# Patient Record
Sex: Male | Born: 1980 | Race: White | Hispanic: No | Marital: Single | Smoking: Current every day smoker
Health system: Southern US, Community
[De-identification: ages and names within clinical notes are randomized; demographics above are authoritative.]

## PROBLEM LIST (undated history)

## (undated) DIAGNOSIS — N200 Calculus of kidney: Secondary | ICD-10-CM

---

## 2012-05-16 ENCOUNTER — Emergency Department: Payer: Self-pay | Admitting: Internal Medicine

## 2012-11-07 ENCOUNTER — Emergency Department: Payer: Self-pay

## 2013-03-14 ENCOUNTER — Emergency Department: Payer: Self-pay | Admitting: Emergency Medicine

## 2013-07-28 ENCOUNTER — Emergency Department: Payer: Self-pay | Admitting: Internal Medicine

## 2013-11-12 ENCOUNTER — Emergency Department: Payer: Self-pay | Admitting: Emergency Medicine

## 2014-07-14 ENCOUNTER — Encounter: Payer: Self-pay | Admitting: Emergency Medicine

## 2014-07-14 ENCOUNTER — Emergency Department
Admission: EM | Admit: 2014-07-14 | Discharge: 2014-07-14 | Disposition: A | Discharge status: Home / Self Care | Payer: Self-pay | Attending: Emergency Medicine | Admitting: Emergency Medicine

## 2014-07-14 ENCOUNTER — Emergency Department: Payer: Self-pay

## 2014-07-14 DIAGNOSIS — Z720 Tobacco use: Secondary | ICD-10-CM | POA: Insufficient documentation

## 2014-07-14 DIAGNOSIS — R109 Unspecified abdominal pain: Secondary | ICD-10-CM | POA: Insufficient documentation

## 2014-07-14 DIAGNOSIS — M549 Dorsalgia, unspecified: Secondary | ICD-10-CM | POA: Insufficient documentation

## 2014-07-14 HISTORY — DX: Calculus of kidney: N20.0

## 2014-07-14 LAB — URINALYSIS COMPLETE WITH MICROSCOPIC (ARMC ONLY)
BILIRUBIN URINE: NEGATIVE
Bacteria, UA: NONE SEEN
GLUCOSE, UA: NEGATIVE mg/dL
Ketones, ur: NEGATIVE mg/dL
LEUKOCYTES UA: NEGATIVE
Nitrite: NEGATIVE
PH: 7 (ref 5.0–8.0)
PROTEIN: NEGATIVE mg/dL
Specific Gravity, Urine: 1.014 (ref 1.005–1.030)

## 2014-07-14 LAB — COMPREHENSIVE METABOLIC PANEL
ALT: 14 U/L — ABNORMAL LOW (ref 17–63)
ANION GAP: 9 (ref 5–15)
AST: 21 U/L (ref 15–41)
Albumin: 4.1 g/dL (ref 3.5–5.0)
Alkaline Phosphatase: 45 U/L (ref 38–126)
BILIRUBIN TOTAL: 0.9 mg/dL (ref 0.3–1.2)
BUN: 8 mg/dL (ref 6–20)
CHLORIDE: 105 mmol/L (ref 101–111)
CO2: 28 mmol/L (ref 22–32)
CREATININE: 0.94 mg/dL (ref 0.61–1.24)
Calcium: 8.7 mg/dL — ABNORMAL LOW (ref 8.9–10.3)
GFR calc Af Amer: >60 mL/min (ref >60)
Glucose, Bld: 114 mg/dL — ABNORMAL HIGH (ref 65–99)
POTASSIUM: 3.8 mmol/L (ref 3.5–5.1)
Sodium: 142 mmol/L (ref 135–145)
Total Protein: 6.8 g/dL (ref 6.5–8.1)

## 2014-07-14 LAB — CBC WITH DIFFERENTIAL/PLATELET
Basophils Absolute: 0.1 10*3/uL (ref 0–0.1)
Basophils Relative: 1 %
EOS PCT: 1 %
Eosinophils Absolute: 0.1 10*3/uL (ref 0–0.7)
HCT: 47.9 % (ref 40.0–52.0)
Hemoglobin: 15.8 g/dL (ref 13.0–18.0)
Lymphocytes Relative: 21 %
Lymphs Abs: 2.4 10*3/uL (ref 1.0–3.6)
MCH: 31.4 pg (ref 26.0–34.0)
MCHC: 33 g/dL (ref 32.0–36.0)
MCV: 95.4 fL (ref 80.0–100.0)
MONOS PCT: 4 %
Monocytes Absolute: 0.5 10*3/uL (ref 0.2–1.0)
NEUTROS ABS: 8.3 10*3/uL — AB (ref 1.4–6.5)
Neutrophils Relative %: 73 %
Platelets: 235 10*3/uL (ref 150–440)
RBC: 5.02 MIL/uL (ref 4.40–5.90)
RDW: 12.9 % (ref 11.5–14.5)
WBC: 11.4 10*3/uL — AB (ref 3.8–10.6)

## 2014-07-14 MED ORDER — FENTANYL CITRATE (PF) 100 MCG/2ML IJ SOLN
INTRAMUSCULAR | Status: AC
  Administered 2014-07-14: 50 ug via INTRAVENOUS
  Filled 2014-07-14: qty 2

## 2014-07-14 MED ORDER — FENTANYL CITRATE (PF) 100 MCG/2ML IJ SOLN
50.0000 ug | Freq: Once | INTRAMUSCULAR | Status: AC
  Administered 2014-07-14: 100 ug via INTRAVENOUS

## 2014-07-14 MED ORDER — KETOROLAC TROMETHAMINE 30 MG/ML IJ SOLN
30.0000 mg | Freq: Once | INTRAMUSCULAR | Status: AC
  Administered 2014-07-14: 30 mg via INTRAVENOUS

## 2014-07-14 MED ORDER — KETOROLAC TROMETHAMINE 30 MG/ML IJ SOLN
INTRAMUSCULAR | Status: AC
  Filled 2014-07-14: qty 1

## 2014-07-14 MED ORDER — SODIUM CHLORIDE 0.9 % IV BOLUS (SEPSIS)
1000.0000 mL | Freq: Once | INTRAVENOUS | Status: AC
  Administered 2014-07-14: 1000 mL via INTRAVENOUS

## 2014-07-14 NOTE — Discharge Instructions (Signed)
Flank Pain °Flank pain refers to pain that is located on the side of the body between the upper abdomen and the back. The pain may occur over a short period of time (acute) or may be long-term or reoccurring (chronic). It may be mild or severe. Flank pain can be caused by many things. °CAUSES  °Some of the more common causes of flank pain include: °· Muscle strains.   °· Muscle spasms.   °· A disease of your spine (vertebral disk disease).   °· A lung infection (pneumonia).   °· Fluid around your lungs (pulmonary edema).   °· A kidney infection.   °· Kidney stones.   °· A very painful skin rash caused by the chickenpox virus (shingles).   °· Gallbladder disease.   °HOME CARE INSTRUCTIONS  °Home care will depend on the cause of your pain. In general, °· Rest as directed by your caregiver. °· Drink enough fluids to keep your urine clear or pale yellow. °· Only take over-the-counter or prescription medicines as directed by your caregiver. Some medicines may help relieve the pain. °· Tell your caregiver about any changes in your pain. °· Follow up with your caregiver as directed. °SEEK IMMEDIATE MEDICAL CARE IF:  °· Your pain is not controlled with medicine.   °· You have new or worsening symptoms. °· Your pain increases.   °· You have abdominal pain.   °· You have shortness of breath.   °· You have persistent nausea or vomiting.   °· You have swelling in your abdomen.   °· You feel faint or pass out.   °· You have blood in your urine. °· You have a fever or persistent symptoms for more than 2-3 days. °· You have a fever and your symptoms suddenly get worse. °MAKE SURE YOU:  °· Understand these instructions. °· Will watch your condition. °· Will get help right away if you are not doing well or get worse. °Document Released: 03/31/2005 Document Revised: 11/02/2011 Document Reviewed: 09/22/2011 °Exit[employer]® Patient Information ©2015 [employer]. This information is not intended to replace advice given to you by your  health care provider. Make sure you discuss any questions you have with your health care provider. ° °

## 2014-07-14 NOTE — ED Notes (Signed)
Right side flank pain rad to right abd for 2 days.

## 2014-07-14 NOTE — ED Provider Notes (Signed)
[facility] Emergency Department Provider Note  ____________________________________________  Time seen: 1640  I have reviewed the triage vital signs and the nursing notes.   HISTORY  Chief Complaint Back Pain and Flank Pain     HPI Leslie Hobbs is a 34 y.o. male who presents with pain in his right back and right flank. He reports this began yesterday and was dull and nagging. Now it is wrapping around further on his abdomen and lower down. He denies any GI symptoms except some mild nausea earlier today. He's had no vomiting or diarrhea. He says his mother told him he had a kidney stone when he was 12. He is not responsive about this history. The pain,'s and goes but it is worse when he is urinating. At times it becomes severe.  While he has been waiting to be seen, he has been treated with 2 doses of fentanyl 50 g each. He reports this is health with the pain. At this time he looks comfortable.    Past Medical History  Diagnosis Date  . Kidney stone     There are no active problems to display for this patient.   No past surgical history on file.  No current outpatient prescriptions on file.  Allergies Review of patient's allergies indicates no known allergies.  No family history on file.  Social History History  Substance Use Topics  . Smoking status: Current Every Day Smoker  . Smokeless tobacco: Not on file  . Alcohol Use: No    Review of Systems  Constitutional: Negative for fever. ENT: Negative for sore throat. Cardiovascular: Negative for chest pain. Respiratory: Negative for shortness of breath. Gastrointestinal: Negative for abdominal pain, vomiting and diarrhea. Genitourinary: Flank pain with urination. See history of present illness Musculoskeletal: Negative for back pain. Skin: Negative for rash. Neurological: Negative for headaches   10-point ROS otherwise  negative.  ____________________________________________   PHYSICAL EXAM:  VITAL SIGNS: ED Triage Vitals  Enc Vitals Group     BP 07/14/14 1338 134/84 mmHg     Pulse Rate 07/14/14 1338 87     Resp 07/14/14 1338 16     Temp 07/14/14 1338 97.8 F (36.6 C)     Temp Source 07/14/14 1338 Oral     SpO2 07/14/14 1338 100 %     Weight 07/14/14 1338 161 lb (73.029 kg)     Height 07/14/14 1338 6' (1.829 m)     Head Cir --      Peak Flow --      Pain Score 07/14/14 1338 5     Pain Loc --      Pain Edu? --      Excl. in GC? --     Constitutional:  Alert and oriented. Well appearing and in no distress. ENT   Head: Normocephalic and atraumatic.   Nose: No congestion/rhinnorhea.   Mouth/Throat: Mucous membranes are moist.      Ears: Cardiovascular: Normal rate, regular rhythm. Respiratory: Normal respiratory effort without tachypnea. Breath sounds are clear and equal bilaterally. No wheezes/rales/rhonchi. Gastrointestinal: Soft with mild tenderness in the right side of the abdomen. No peritoneal signs. No distention.  Back: No muscle spasm, no tenderness. There is right-sided CVA tenderness. Musculoskeletal: Nontender with normal range of motion in all extremities.  No noted edema. Neurologic:  Normal speech and language. No gross focal neurologic deficits are appreciated.  Skin:  Skin is warm, dry. No rash noted. Psychiatric: Mood and affect are normal. Speech and  behavior are normal.  ____________________________________________    LABS (pertinent positives/negatives)  CBC and metabolic panel are within normal limits Urinalysis shows red blood cells too numerous to count.  _____________________________   RADIOLOGY  Renal ultrasound:  IMPRESSION: Unremarkable renal ultrasound examination.  ____________________________________________  ____________________________________________   INITIAL IMPRESSION / ASSESSMENT AND PLAN / ED COURSE  Pleasant 34 year old  male with right-sided flank pain. He does have hematuria noted on the urinalysis. This is most likely a renal stone. He does look comfortable currently. Before treating with ongoing pain medication I would like to confirm the diagnosis. We discussed the pros and cons of CT versus ultrasound. We will do an ultrasound at this time. I will treat him with Toradol through an IV that 30 been established for his discomfort.  1835 - ultrasound is negative. No sign of hydroureter, no stone. The patient looks comfortable. He is eager to leave because he has to help his wife get to work at 8:00. We have discussed the negative ultrasound and I have offered to do a CT scan for him, but due to his time constraint he is declining this currently. With the negative ultrasound I have a reservation about prescribing any narcotic pain medications. We will discharge him, appearing comfortable, without prescription. We will provide him with the name and number of the urologist for follow-up. He is welcome to come back here if his pain gets worse and we can do a CT scan. ____________________________________________   FINAL CLINICAL IMPRESSION(S) / ED DIAGNOSES  Final diagnoses:  Right flank pain      Darien Ramusavid W Nandini Bogdanski, MD 07/14/14 1836

## 2018-08-08 ENCOUNTER — Emergency Department (HOSPITAL_COMMUNITY)
Admission: EM | Admit: 2018-08-08 | Discharge: 2018-08-08 | Disposition: A | Discharge status: Home / Self Care | Payer: Self-pay

## 2018-08-08 ENCOUNTER — Emergency Department
Admission: EM | Admit: 2018-08-08 | Discharge: 2018-08-08 | Disposition: A | Discharge status: Home / Self Care | Payer: Self-pay | Attending: Emergency Medicine | Admitting: Emergency Medicine

## 2018-08-08 ENCOUNTER — Encounter: Payer: Self-pay | Admitting: Emergency Medicine

## 2018-08-08 ENCOUNTER — Other Ambulatory Visit: Payer: Self-pay

## 2018-08-08 DIAGNOSIS — Z5321 Procedure and treatment not carried out due to patient leaving prior to being seen by health care provider: Secondary | ICD-10-CM | POA: Insufficient documentation

## 2018-08-08 DIAGNOSIS — R079 Chest pain, unspecified: Secondary | ICD-10-CM | POA: Insufficient documentation

## 2018-08-08 LAB — BASIC METABOLIC PANEL
Anion gap: 9 (ref 5–15)
BUN: 12 mg/dL (ref 6–20)
CO2: 28 mmol/L (ref 22–32)
Calcium: 8.8 mg/dL — ABNORMAL LOW (ref 8.9–10.3)
Chloride: 105 mmol/L (ref 98–111)
Creatinine, Ser: 0.72 mg/dL (ref 0.61–1.24)
GFR calc Af Amer: >60 mL/min (ref >60)
GFR calc non Af Amer: >60 mL/min (ref >60)
Glucose, Bld: 90 mg/dL (ref 70–99)
Potassium: 4.2 mmol/L (ref 3.5–5.1)
Sodium: 142 mmol/L (ref 135–145)

## 2018-08-08 LAB — CBC
HCT: 38.6 % — ABNORMAL LOW (ref 39.0–52.0)
Hemoglobin: 12.6 g/dL — ABNORMAL LOW (ref 13.0–17.0)
MCH: 32 pg (ref 26.0–34.0)
MCHC: 32.6 g/dL (ref 30.0–36.0)
MCV: 98 fL (ref 80.0–100.0)
Platelets: 178 10*3/uL (ref 150–400)
RBC: 3.94 MIL/uL — ABNORMAL LOW (ref 4.22–5.81)
RDW: 12.4 % (ref 11.5–15.5)
WBC: 6.7 10*3/uL (ref 4.0–10.5)
nRBC: 0 % (ref 0.0–0.2)

## 2018-08-08 LAB — TROPONIN I: Troponin I: <0.03 ng/mL (ref <0.03)

## 2018-08-08 MED ORDER — SODIUM CHLORIDE 0.9% FLUSH: 3.0000 mL | Freq: Once | INTRAVENOUS | Status: DC

## 2018-08-08 NOTE — ED Triage Notes (Signed)
Patient not present in waiting area or restroom.

## 2018-08-08 NOTE — ED Triage Notes (Signed)
Patient called for Triage x 2. Not in waiting area.

## 2018-08-08 NOTE — ED Triage Notes (Signed)
O mid to right sided chest pain x 3 days.  No action / activity makes pain better or worse.  Also c/o nausea and intermittent SOB.

## 2018-08-08 NOTE — ED Triage Notes (Signed)
Patient called for Triage. No answer. 

## 2020-09-11 ENCOUNTER — Encounter (HOSPITAL_COMMUNITY): Payer: Self-pay

## 2020-09-11 ENCOUNTER — Other Ambulatory Visit: Payer: Self-pay

## 2020-09-11 ENCOUNTER — Emergency Department (HOSPITAL_COMMUNITY)
Admission: EM | Admit: 2020-09-11 | Discharge: 2020-09-11 | Disposition: A | Discharge status: Home / Self Care | Payer: Self-pay | Attending: Emergency Medicine | Admitting: Emergency Medicine

## 2020-09-11 DIAGNOSIS — Y900 Blood alcohol level of less than 20 mg/100 ml: Secondary | ICD-10-CM | POA: Insufficient documentation

## 2020-09-11 DIAGNOSIS — T50901A Poisoning by unspecified drugs, medicaments and biological substances, accidental (unintentional), initial encounter: Secondary | ICD-10-CM

## 2020-09-11 DIAGNOSIS — T402X1A Poisoning by other opioids, accidental (unintentional), initial encounter: Secondary | ICD-10-CM | POA: Insufficient documentation

## 2020-09-11 DIAGNOSIS — F17200 Nicotine dependence, unspecified, uncomplicated: Secondary | ICD-10-CM | POA: Insufficient documentation

## 2020-09-11 LAB — COMPREHENSIVE METABOLIC PANEL
ALT: 28 U/L (ref 0–44)
AST: 25 U/L (ref 15–41)
Albumin: 4.8 g/dL (ref 3.5–5.0)
Alkaline Phosphatase: 64 U/L (ref 38–126)
Anion gap: 13 (ref 5–15)
BUN: 18 mg/dL (ref 6–20)
CO2: 24 mmol/L (ref 22–32)
Calcium: 8.9 mg/dL (ref 8.9–10.3)
Chloride: 101 mmol/L (ref 98–111)
Creatinine, Ser: 0.91 mg/dL (ref 0.61–1.24)
GFR, Estimated: >60 mL/min (ref >60)
Glucose, Bld: 178 mg/dL — ABNORMAL HIGH (ref 70–99)
Potassium: 4.5 mmol/L (ref 3.5–5.1)
Sodium: 138 mmol/L (ref 135–145)
Total Bilirubin: 1.2 mg/dL (ref 0.3–1.2)
Total Protein: 7.8 g/dL (ref 6.5–8.1)

## 2020-09-11 LAB — CBC
HCT: 46.8 % (ref 39.0–52.0)
Hemoglobin: 15.5 g/dL (ref 13.0–17.0)
MCH: 31.6 pg (ref 26.0–34.0)
MCHC: 33.1 g/dL (ref 30.0–36.0)
MCV: 95.5 fL (ref 80.0–100.0)
Platelets: 321 10*3/uL (ref 150–400)
RBC: 4.9 MIL/uL (ref 4.22–5.81)
RDW: 11.9 % (ref 11.5–15.5)
WBC: 15.3 10*3/uL — ABNORMAL HIGH (ref 4.0–10.5)
nRBC: 0 % (ref 0.0–0.2)

## 2020-09-11 LAB — ETHANOL: Alcohol, Ethyl (B): <10 mg/dL (ref <10)

## 2020-09-11 MED ORDER — NALOXONE HCL 4 MG/0.1ML NA LIQD: 1.0000 | Freq: Once | NASAL | 0 refills | Status: AC

## 2020-09-11 MED ORDER — ONDANSETRON 8 MG PO TBDP
8.0000 mg | ORAL_TABLET | Freq: Once | ORAL | Status: AC
  Administered 2020-09-11: 8 mg via ORAL
  Filled 2020-09-11: qty 1

## 2020-09-11 NOTE — ED Notes (Signed)
When providing discharge instructions to patient, he became nauseas and vomited in trash can. MD notified and presented to bedside. Pt oriented x4, ambulating in room. MD ordered prescription for narcan and patient instructed on importance and how to use.

## 2020-09-11 NOTE — ED Provider Notes (Signed)
[facility]EMERGENCY DEPT Provider Note   CSN: [id number] Arrival date & time: 09/11/20  1206     History Chief Complaint  Patient presents with   Drug Overdose    Leslie Hobbs is a 40 y.o. male.  Patient brought in for evaluation of decreased responsiveness.  Possibly use drugs.  Pulse ox was low and placed on oxygen.  Patient arousable to voice.  He denies any medical complaints.  He admits to using some fentanyl.  The history is provided by the patient.  Drug Overdose This is a new problem. The problem has not changed since onset.Pertinent negatives include no chest pain, no abdominal pain, no headaches and no shortness of breath. Nothing aggravates the symptoms. Nothing relieves the symptoms. He has tried nothing for the symptoms. The treatment provided no relief.      Past Medical History:  Diagnosis Date   Kidney stone     There are no problems to display for this patient.   History reviewed. No pertinent surgical history.     History reviewed. No pertinent family history.  Social History   Tobacco Use   Smoking status: Every Day   Smokeless tobacco: Never  Substance Use Topics   Alcohol use: No    Home Medications Prior to Admission medications   Not on File    Allergies    Patient has no known allergies.  Review of Systems   Review of Systems  Constitutional:  Negative for fever.  HENT:  Negative for sore throat.   Eyes:  Negative for visual disturbance.  Respiratory:  Negative for shortness of breath.   Cardiovascular:  Negative for chest pain.  Gastrointestinal:  Negative for abdominal pain.  Genitourinary:  Negative for dysuria.  Musculoskeletal:  Negative for neck pain.  Skin:  Negative for rash.  Neurological:  Negative for headaches.   Physical Exam Updated Vital Signs BP 126/84   Pulse 87   Temp 98.5 F (36.9 C) (Oral)   Resp 13   SpO2 94%   Physical Exam Vitals and nursing note reviewed.   Constitutional:      General: He is sleeping.     Appearance: Normal appearance. He is well-developed.  HENT:     Head: Normocephalic and atraumatic.  Eyes:     Conjunctiva/sclera: Conjunctivae normal.  Cardiovascular:     Rate and Rhythm: Normal rate and regular rhythm.     Heart sounds: No murmur heard. Pulmonary:     Effort: Pulmonary effort is normal. No respiratory distress.     Breath sounds: Normal breath sounds.  Abdominal:     Palpations: Abdomen is soft.     Tenderness: There is no abdominal tenderness.  Musculoskeletal:        General: No deformity or signs of injury. Normal range of motion.     Cervical back: Neck supple.  Skin:    General: Skin is warm and dry.  Neurological:     General: No focal deficit present.     Mental Status: He is easily aroused.    ED Results / Procedures / Treatments   Labs (all labs ordered are listed, but only abnormal results are displayed) Labs Reviewed  COMPREHENSIVE METABOLIC PANEL - Abnormal; Notable for the following components:      Result Value   Glucose, Bld 178 (*)    All other components within normal limits  CBC - Abnormal; Notable for the following components:   WBC 15.3 (*)    All other  components within normal limits  ETHANOL    EKG None  Radiology No results found.  Procedures Procedures   Medications Ordered in ED Medications - No data to display  ED Course  I have reviewed the triage vital signs and the nursing notes.  Pertinent labs & imaging results that were available during my care of the patient were reviewed by me and considered in my medical decision making (see chart for details).  Clinical Course as of 09/11/20 1905  Caleen Essex Sep 11, 2020  1355 Patient placed on oxygen for low sat.  Sleeping soundly. [MB]    Clinical Course User Index [MB] Terrilee Files, MD   MDM Rules/Calculators/A&P                          Patient here after probable opiate overdose.  Somnolent arousable to  voice.  Requiring some supplemental oxygen due to desaturations.  Labs unremarkable.  Normal sinus rhythm on monitor.  Over period of time patient with improvement of mental status now off oxygen satting well on room air.  Vomited x1.  Zofran given.  Recommended patient to stay in the emergency department for further period of observation.  He declines this and is asking to be discharged.  Will discharge with Narcan prescription.  Counseled to seek substance abuse resources.  Return instructions discussed  Final Clinical Impression(s) / ED Diagnoses Final diagnoses:  Accidental drug overdose, initial encounter    Rx / DC Orders ED Discharge Orders     None        Terrilee Files, MD 09/11/20 1907

## 2020-09-11 NOTE — ED Triage Notes (Signed)
Brought back from triage, O2 on RA was 86%, admitting to snorting something, denies IV drug use. Somnolent.

## 2020-09-11 NOTE — ED Triage Notes (Signed)
Pt POV , states he took "some powdered aspirin" and then got hot. Pt friend stated he took fentanyl and "passed out:" Pt responsive in triage, slow to answer. Sp2 86% RA

## 2020-10-03 ENCOUNTER — Emergency Department
Admission: EM | Admit: 2020-10-03 | Discharge: 2020-10-03 | Disposition: A | Discharge status: Home / Self Care | Payer: Self-pay | Attending: Emergency Medicine | Admitting: Emergency Medicine

## 2020-10-03 ENCOUNTER — Other Ambulatory Visit: Payer: Self-pay

## 2020-10-03 DIAGNOSIS — F17200 Nicotine dependence, unspecified, uncomplicated: Secondary | ICD-10-CM | POA: Insufficient documentation

## 2020-10-03 DIAGNOSIS — L0201 Cutaneous abscess of face: Secondary | ICD-10-CM | POA: Insufficient documentation

## 2020-10-03 LAB — CBC
HCT: 41.1 % (ref 39.0–52.0)
Hemoglobin: 14.5 g/dL (ref 13.0–17.0)
MCH: 32.7 pg (ref 26.0–34.0)
MCHC: 35.3 g/dL (ref 30.0–36.0)
MCV: 92.8 fL (ref 80.0–100.0)
Platelets: 199 10*3/uL (ref 150–400)
RBC: 4.43 MIL/uL (ref 4.22–5.81)
RDW: 11.8 % (ref 11.5–15.5)
WBC: 11.2 10*3/uL — ABNORMAL HIGH (ref 4.0–10.5)
nRBC: 0 % (ref 0.0–0.2)

## 2020-10-03 LAB — COMPREHENSIVE METABOLIC PANEL
ALT: 30 U/L (ref 0–44)
AST: 23 U/L (ref 15–41)
Albumin: 4 g/dL (ref 3.5–5.0)
Alkaline Phosphatase: 54 U/L (ref 38–126)
Anion gap: 11 (ref 5–15)
BUN: 9 mg/dL (ref 6–20)
CO2: 25 mmol/L (ref 22–32)
Calcium: 8.7 mg/dL — ABNORMAL LOW (ref 8.9–10.3)
Chloride: 98 mmol/L (ref 98–111)
Creatinine, Ser: 0.77 mg/dL (ref 0.61–1.24)
GFR, Estimated: >60 mL/min (ref >60)
Glucose, Bld: 117 mg/dL — ABNORMAL HIGH (ref 70–99)
Potassium: 4 mmol/L (ref 3.5–5.1)
Sodium: 134 mmol/L — ABNORMAL LOW (ref 135–145)
Total Bilirubin: 1.7 mg/dL — ABNORMAL HIGH (ref 0.3–1.2)
Total Protein: 7.1 g/dL (ref 6.5–8.1)

## 2020-10-03 LAB — LACTIC ACID, PLASMA: Lactic Acid, Venous: 1.2 mmol/L (ref 0.5–1.9)

## 2020-10-03 MED ORDER — LIDOCAINE-EPINEPHRINE 2 %-1:100000 IJ SOLN
20.0000 mL | Freq: Once | INTRAMUSCULAR | Status: AC
  Administered 2020-10-03: 20 mL

## 2020-10-03 MED ORDER — SULFAMETHOXAZOLE-TRIMETHOPRIM 800-160 MG PO TABS: 1.0000 | ORAL_TABLET | Freq: Two times a day (BID) | ORAL | 0 refills | Status: AC

## 2020-10-03 MED ORDER — CEPHALEXIN 500 MG PO CAPS: 500.0000 mg | ORAL_CAPSULE | Freq: Four times a day (QID) | ORAL | 0 refills | Status: AC

## 2020-10-03 MED ORDER — SULFAMETHOXAZOLE-TRIMETHOPRIM 800-160 MG PO TABS
1.0000 | ORAL_TABLET | Freq: Once | ORAL | Status: AC
  Administered 2020-10-03: 1 via ORAL
  Filled 2020-10-03: qty 1

## 2020-10-03 MED ORDER — CEPHALEXIN 500 MG PO CAPS
500.0000 mg | ORAL_CAPSULE | Freq: Once | ORAL | Status: AC
  Administered 2020-10-03: 500 mg via ORAL
  Filled 2020-10-03: qty 1

## 2020-10-03 NOTE — ED Provider Notes (Signed)
[facility] Emergency Department Provider Note   ____________________________________________   Event Date/Time   First MD Initiated Contact with Patient 10/03/20 1231     (approximate)  I have reviewed the triage vital signs and the nursing notes.   HISTORY  Chief Complaint Abscess    HPI Leslie Hobbs is a 40 y.o. male with no significant past medical history presents to the ED complaining of facial swelling.  Patient reports that for the past 3 to 4 days he has been dealing with increasing swelling to his right lower face.  He states that he was initially concerned that he could have an ingrown hair but that the area has gotten more red, swollen, and painful.  He was seen in the [facility] ED yesterday, reports having a CT scan that was concerning for abscess.  There was concerned that this could be related to a dental infection and arrangements were made for patient to be transferred to [facility].  He eventually signed out AMA after receiving IV antibiotics prior to transfer occurring.  He states that the swelling seems to have improved since he received antibiotics, he has also noticed pus starting to drain from the outside of his cheek.  He denies any fevers, nausea or vomiting but does describe difficulty eating and drinking due to pain when moving his jaw.  He has not noticed any pain or swelling along his teeth and denies any pain in his throat.  He states he was not prescribed antibiotics when leaving the [facility] ED.  He does state that he was stung by bees multiple times to his bilateral upper extremities 1 to 2 weeks ago.  He adamantly denies any IV drug use, does admit to occasionally using opiates, which he snorts.        Past Medical History:  Diagnosis Date   Kidney stone     There are no problems to display for this patient.   History reviewed. No pertinent surgical history.  Prior to Admission medications   Medication Sig  Start Date End Date Taking? Authorizing Provider  cephALEXin (KEFLEX) 500 MG capsule Take 1 capsule (500 mg total) by mouth 4 (four) times daily for 7 days. 10/03/20 10/10/20 Yes Chesley Noon, MD  sulfamethoxazole-trimethoprim (BACTRIM DS) 800-160 MG tablet Take 1 tablet by mouth 2 (two) times daily for 7 days. 10/03/20 10/10/20 Yes Chesley Noon, MD    Allergies Patient has no known allergies.  History reviewed. No pertinent family history.  Social History Social History   Tobacco Use   Smoking status: Every Day   Smokeless tobacco: Never  Substance Use Topics   Alcohol use: No    Review of Systems  Constitutional: No fever/chills Eyes: No visual changes. ENT: No sore throat.  Positive for facial pain and swelling. Cardiovascular: Denies chest pain. Respiratory: Denies shortness of breath. Gastrointestinal: No abdominal pain.  No nausea, no vomiting.  No diarrhea.  No constipation. Genitourinary: Negative for dysuria. Musculoskeletal: Negative for back pain. Skin: Negative for rash. Neurological: Negative for headaches, focal weakness or numbness.  ____________________________________________   PHYSICAL EXAM:  VITAL SIGNS: ED Triage Vitals  Enc Vitals Group     BP 10/03/20 1129 117/88     Pulse Rate 10/03/20 1129 (!) 115     Resp 10/03/20 1129 18     Temp 10/03/20 1129 99.2 F (37.3 C)     Temp Source 10/03/20 1129 Oral     SpO2 10/03/20 1129 98 %  Weight 10/03/20 1130 180 lb (81.6 kg)     Height 10/03/20 1130 6' (1.829 m)     Head Circumference --      Peak Flow --      Pain Score 10/03/20 1129 6     Pain Loc --      Pain Edu? --      Excl. in GC? --     Constitutional: Alert and oriented. Eyes: Conjunctivae are normal. Head: Atraumatic.  Edema, erythema, and induration with central area of purulent drainage to right lower cheek. Nose: No congestion/rhinnorhea. Mouth/Throat: Mucous membranes are moist.  Poor dentition but with no edema or fluctuance  along upper or lower gumline bilaterally.  No dental tenderness to palpation. Neck: Normal ROM Cardiovascular: Normal rate, regular rhythm. Grossly normal heart sounds. Respiratory: Normal respiratory effort.  No retractions. Lungs CTAB. Gastrointestinal: Soft and nontender. No distention. Genitourinary: deferred Musculoskeletal: No lower extremity tenderness nor edema. Neurologic:  Normal speech and language. No gross focal neurologic deficits are appreciated. Skin:  Skin is warm, dry and intact. No rash noted. Psychiatric: Mood and affect are normal. Speech and behavior are normal.  ____________________________________________   LABS (all labs ordered are listed, but only abnormal results are displayed)  Labs Reviewed  CBC - Abnormal; Notable for the following components:      Result Value   WBC 11.2 (*)    All other components within normal limits  COMPREHENSIVE METABOLIC PANEL - Abnormal; Notable for the following components:   Sodium 134 (*)    Glucose, Bld 117 (*)    Calcium 8.7 (*)    Total Bilirubin 1.7 (*)    All other components within normal limits  CULTURE, BLOOD (ROUTINE X 2)  CULTURE, BLOOD (ROUTINE X 2)  LACTIC ACID, PLASMA  LACTIC ACID, PLASMA    PROCEDURES  Procedure(s) performed (including Critical Care):  Marland KitchenMarland KitchenIncision and Drainage  Date/Time: 10/03/2020 2:07 PM Performed by: Chesley Noon, MD Authorized by: Chesley Noon, MD   Consent:    Consent obtained:  Verbal   Consent given by:  Patient   Risks, benefits, and alternatives were discussed: yes     Risks discussed:  Bleeding, incomplete drainage, pain, infection and damage to other organs   Alternatives discussed:  No treatment and referral Universal protocol:    Patient identity confirmed:  Verbally with patient and arm band Location:    Type:  Abscess   Size:  1-2cm   Location:  Head   Head location:  Face Sedation:    Sedation type:  None Anesthesia:    Anesthesia method:  Local  infiltration   Local anesthetic:  Lidocaine 1% WITH epi Procedure type:    Complexity:  Simple Procedure details:    Ultrasound guidance: no     Needle aspiration: no     Incision types:  Elliptical   Wound management:  Probed and deloculated and irrigated with saline   Drainage:  Purulent   Drainage amount:  Moderate   Wound treatment:  Wound left open   Packing materials:  None Post-procedure details:    Procedure completion:  Tolerated well, no immediate complications   ____________________________________________   INITIAL IMPRESSION / ASSESSMENT AND PLAN / ED COURSE      40 year old male with no significant past medical history presents to the ED with ongoing facial pain and swelling after being seen in the [facility] ED and given IV antibiotics yesterday.  There was initial concern in the ED yesterday that he could have odontogenic  infection, although he has no dental tenderness, edema, or fluctuance on my evaluation.  Infectious process does appear more superficial and limited to his cheek.  I am not able to review images from the [facility] ED although per radiology read a superficial infection is on the differential.  He is having purulent drainage from his cheek which again seems to make a dental infection less likely.  We will anesthetized the area with lidocaine and plan to make a small incision to allow for additional drainage of apparent abscess.  Patient noted to be tachycardic with elevated white count, although he is afebrile and does not appear septic at this time.  Given his history of polysubstance abuse, we will draw blood cultures and lactate to ensure he is not bacteremic.  Lactate within normal limits, incision and drainage performed of right cheek with improvement in patient's pain.  He was able to tolerate oral antibiotics without difficulty and is appropriate for discharge home.  He was counseled to return to the ED in 2 days for wound recheck, will be prescribed  Keflex and Bactrim for associated cellulitis.  Patient agrees with plan.      ____________________________________________   FINAL CLINICAL IMPRESSION(S) / ED DIAGNOSES  Final diagnoses:  Facial abscess     ED Discharge Orders          Ordered    cephALEXin (KEFLEX) 500 MG capsule  4 times daily        10/03/20 1525    sulfamethoxazole-trimethoprim (BACTRIM DS) 800-160 MG tablet  2 times daily        10/03/20 1525             Note:  This document was prepared using Dragon voice recognition software and may include unintentional dictation errors.    Chesley Noon, MD 10/03/20 [phone]

## 2020-10-03 NOTE — ED Notes (Signed)
Discussed with Dr Larinda Buttery. Basic labs repeated.

## 2020-10-03 NOTE — ED Triage Notes (Signed)
Pt comes pov with abscess on right cheek. Was seen at [facility] last night, had a lot of blood taken, and CT scan done, but MD didn't "feel comfortable" lancing it last night. Had IV antibiotics at [facility] and was d/c.

## 2020-10-03 NOTE — ED Notes (Signed)
Lab called for blood cultures.

## 2020-10-08 LAB — CULTURE, BLOOD (ROUTINE X 2)
Culture: NO GROWTH
Culture: NO GROWTH
Special Requests: ADEQUATE

## 2021-01-24 ENCOUNTER — Emergency Department: Payer: Self-pay

## 2021-01-24 ENCOUNTER — Emergency Department
Admission: EM | Admit: 2021-01-24 | Discharge: 2021-01-24 | Disposition: A | Discharge status: Home / Self Care | Payer: Self-pay | Attending: Emergency Medicine | Admitting: Emergency Medicine

## 2021-01-24 ENCOUNTER — Other Ambulatory Visit: Payer: Self-pay

## 2021-01-24 ENCOUNTER — Encounter: Payer: Self-pay | Admitting: Emergency Medicine

## 2021-01-24 DIAGNOSIS — W230XXA Caught, crushed, jammed, or pinched between moving objects, initial encounter: Secondary | ICD-10-CM | POA: Insufficient documentation

## 2021-01-24 DIAGNOSIS — F17200 Nicotine dependence, unspecified, uncomplicated: Secondary | ICD-10-CM | POA: Insufficient documentation

## 2021-01-24 DIAGNOSIS — S60221A Contusion of right hand, initial encounter: Secondary | ICD-10-CM | POA: Insufficient documentation

## 2021-01-24 DIAGNOSIS — L03012 Cellulitis of left finger: Secondary | ICD-10-CM | POA: Insufficient documentation

## 2021-01-24 MED ORDER — SULFAMETHOXAZOLE-TRIMETHOPRIM 800-160 MG PO TABS
1.0000 | ORAL_TABLET | Freq: Once | ORAL | Status: AC
  Administered 2021-01-24: 18:00:00 1 via ORAL
  Filled 2021-01-24: qty 1

## 2021-01-24 MED ORDER — SULFAMETHOXAZOLE-TRIMETHOPRIM 800-160 MG PO TABS: 1.0000 | ORAL_TABLET | Freq: Two times a day (BID) | ORAL | 0 refills | Status: AC

## 2021-01-24 NOTE — ED Triage Notes (Signed)
Pt reports slammed his right hand thumb in a door yesterday and thinks it may be broken. Swelling noted, states painful to move

## 2021-01-24 NOTE — ED Notes (Signed)
Dc ppw provided to patient. Followup information provided. RX information given. Questions answered. Pt provides verbal consent for discharge. VS declined at dc. Pt assisted off unit. 

## 2021-01-24 NOTE — ED Provider Notes (Signed)
[facility] Emergency Department Provider Note  ____________________________________________  Time seen: Approximately 5:05 PM  I have reviewed the triage vital signs and the nursing notes.   HISTORY  Chief Complaint Hand Pain    HPI Leslie Hobbs is a 40 y.o. male who presents emergency department for 2 complaints.  Patient is primarily concerned about his right hand.  He smashed his finger in a barn door yesterday, is having pain and swelling to the area.  Patient is also concerned that he may have an infection to the digit.  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  He states that the skin split, has been draining some purulent material since he injured his thumb yesterday.  No gross swelling of the thumb.  He still able to extend and flex the thumb at this time.       Past Medical History:  Diagnosis Date   Kidney stone     There are no problems to display for this patient.   History reviewed. No pertinent surgical history.  Prior to Admission medications   Medication Sig Start Date End Date Taking? Authorizing Provider  sulfamethoxazole-trimethoprim (BACTRIM DS) 800-160 MG tablet Take 1 tablet by mouth 2 (two) times daily. 01/24/21  Yes Rogelio Winbush, Delorise Royals, PA-C    Allergies Patient has no known allergies.  No family history on file.  Social History Social History   Tobacco Use   Smoking status: Every Day   Smokeless tobacco: Never  Substance Use Topics   Alcohol use: No     Review of Systems  Constitutional: No fever/chills Eyes: No visual changes. No discharge ENT: No upper respiratory complaints. Cardiovascular: no chest pain. Respiratory: no cough. No SOB. Gastrointestinal: No abdominal pain.  No nausea, no vomiting.  No diarrhea.  No constipation. Musculoskeletal: Injury and possible infection to the right thumb Skin: Negative for rash, abrasions, lacerations,  ecchymosis. Neurological: Negative for headaches, focal weakness or numbness.  10 System ROS otherwise negative.  ____________________________________________   PHYSICAL EXAM:  VITAL SIGNS: ED Triage Vitals  Enc Vitals Group     BP 01/24/21 1519 111/89     Pulse Rate 01/24/21 1519 94     Resp 01/24/21 1519 20     Temp 01/24/21 1519 98.2 F (36.8 C)     Temp Source 01/24/21 1519 Oral     SpO2 01/24/21 1519 97 %     Weight 01/24/21 1444 180 lb (81.6 kg)     Height 01/24/21 1444 6' (1.829 m)     Head Circumference --      Peak Flow --      Pain Score 01/24/21 1444 7     Pain Loc --      Pain Edu? --      Excl. in GC? --      Constitutional: Alert and oriented. Well appearing and in no acute distress. Eyes: Conjunctivae are normal. PERRL. EOMI. Head: Atraumatic. ENT:      Ears:       Nose: No congestion/rhinnorhea.      Mouth/Throat: Mucous membranes are moist.  Neck: No stridor.    Cardiovascular: Normal rate, regular rhythm. Normal S1 and S2.  Good peripheral circulation. Respiratory: Normal respiratory effort without tachypnea or retractions. Lungs CTAB. Good air entry to the bases with no decreased or absent breath sounds. Musculoskeletal: Full range of motion to all extremities. No gross deformities appreciated.  Visualization of the right thumb  reveals some edema and ecchymosis along the distal phalanx.  Along the medial nailbed there is some erythema, purulent drainage consistent with a paronychia.  No fluctuance concerning for underlying abscess or felon's.  No extension of the erythema or edema along the found to be concern for tenosynovitis.  Full range of motion.  Sensation and capillary refill intact. Neurologic:  Normal speech and language. No gross focal neurologic deficits are appreciated.  Skin:  Skin is warm, dry and intact. No rash noted. Psychiatric: Mood and affect are normal. Speech and behavior are normal. Patient exhibits appropriate insight and  judgement.   ____________________________________________   LABS (all labs ordered are listed, but only abnormal results are displayed)  Labs Reviewed - No data to display ____________________________________________  EKG   ____________________________________________  RADIOLOGY I personally viewed and evaluated these images as part of my medical decision making, as well as reviewing the written report by the radiologist.  ED Provider Interpretation: No acute traumatic findings about the right hand  DG Hand Complete Right  Result Date: 01/24/2021 CLINICAL DATA:  RIGHT thumb injury. Hand pain. Concern for infection. EXAM: RIGHT HAND - COMPLETE 3+ VIEW COMPARISON:  None. FINDINGS: Osseous alignment is normal. Bone mineralization is normal. No fracture line or displaced fracture fragment is seen. No acute-appearing cortical irregularity or osseous lesion. No destructive changes, focal demineralization or other secondary signs of osteomyelitis. Soft tissues about the RIGHT hand are unremarkable. No soft tissue gas seen. IMPRESSION: Normal plain film examination of the RIGHT hand. No acute findings. No fracture or dislocation. No evidence of osteomyelitis. No soft tissue gas. Electronically Signed   By: Bary Viviano M.D.   On: 01/24/2021 16:02    ____________________________________________    PROCEDURES  Procedure(s) performed:    Procedures    Medications  sulfamethoxazole-trimethoprim (BACTRIM DS) 800-160 MG per tablet 1 tablet (has no administration in time range)     ____________________________________________   INITIAL IMPRESSION / ASSESSMENT AND PLAN / ED COURSE  Pertinent labs & imaging results that were available during my care of the patient were reviewed by me and considered in my medical decision making (see chart for details).  Review of the [place] CSRS was performed in accordance of the [facility] prior to dispensing any controlled drugs.           Patient's  diagnosis is consistent with hand contusion, paronychia of the thumb.  Patient presents emergency department after smashing his finger in a barn door yesterday.  Patient is also concerned that he may have an infection to the area.  Imaging was reassuring with no acute traumatic findings and no evidence of osteomyelitis.  Exam was concerning for paronychia but no evidence of felon's or infectious tenosynovitis.  Patiently placed on Bactrim for infection.  Anti-inflammatory and Tylenol at home for symptom relief.  Follow-up with primary care as needed.  Return precautions discussed with the patient at this time.  There is no indication for incision and drainage of paronychia currently as it is spontaneously draining.. Patient is given ED precautions to return to the ED for any worsening or new symptoms.     ____________________________________________  FINAL CLINICAL IMPRESSION(S) / ED DIAGNOSES  Final diagnoses:  Contusion of right hand, initial encounter  Paronychia of left thumb      NEW MEDICATIONS STARTED DURING THIS VISIT:  ED Discharge Orders          Ordered    sulfamethoxazole-trimethoprim (BACTRIM DS) 800-160 MG tablet  2 times daily  01/24/21 1726                This chart was dictated using voice recognition software/Dragon. Despite best efforts to proofread, errors can occur which can change the meaning. Any change was purely unintentional.    Racheal Patches, PA-C 01/24/21 1729    Gilles Chiquito, MD 01/24/21 [phone]

## 2021-02-01 ENCOUNTER — Encounter: Payer: Self-pay | Admitting: *Deleted

## 2021-02-01 ENCOUNTER — Other Ambulatory Visit: Payer: Self-pay

## 2021-02-01 ENCOUNTER — Emergency Department: Payer: Self-pay

## 2021-02-01 DIAGNOSIS — M79601 Pain in right arm: Secondary | ICD-10-CM | POA: Insufficient documentation

## 2021-02-01 DIAGNOSIS — M79644 Pain in right finger(s): Secondary | ICD-10-CM | POA: Insufficient documentation

## 2021-02-01 DIAGNOSIS — Z5321 Procedure and treatment not carried out due to patient leaving prior to being seen by health care provider: Secondary | ICD-10-CM | POA: Insufficient documentation

## 2021-02-01 LAB — COMPREHENSIVE METABOLIC PANEL
ALT: 19 U/L (ref 0–44)
AST: 17 U/L (ref 15–41)
Albumin: 4.3 g/dL (ref 3.5–5.0)
Alkaline Phosphatase: 57 U/L (ref 38–126)
Anion gap: 8 (ref 5–15)
BUN: 16 mg/dL (ref 6–20)
CO2: 22 mmol/L (ref 22–32)
Calcium: 9.1 mg/dL (ref 8.9–10.3)
Chloride: 106 mmol/L (ref 98–111)
Creatinine, Ser: 0.83 mg/dL (ref 0.61–1.24)
GFR, Estimated: >60 mL/min (ref >60)
Glucose, Bld: 96 mg/dL (ref 70–99)
Potassium: 4.5 mmol/L (ref 3.5–5.1)
Sodium: 136 mmol/L (ref 135–145)
Total Bilirubin: 0.5 mg/dL (ref 0.3–1.2)
Total Protein: 8.3 g/dL — ABNORMAL HIGH (ref 6.5–8.1)

## 2021-02-01 LAB — CBC WITH DIFFERENTIAL/PLATELET
Abs Immature Granulocytes: 0.02 10*3/uL (ref 0.00–0.07)
Basophils Absolute: 0 10*3/uL (ref 0.0–0.1)
Basophils Relative: 1 %
Eosinophils Absolute: 0.1 10*3/uL (ref 0.0–0.5)
Eosinophils Relative: 1 %
HCT: 46.3 % (ref 39.0–52.0)
Hemoglobin: 15.6 g/dL (ref 13.0–17.0)
Immature Granulocytes: 0 %
Lymphocytes Relative: 19 %
Lymphs Abs: 1.6 10*3/uL (ref 0.7–4.0)
MCH: 31.7 pg (ref 26.0–34.0)
MCHC: 33.7 g/dL (ref 30.0–36.0)
MCV: 94.1 fL (ref 80.0–100.0)
Monocytes Absolute: 0.4 10*3/uL (ref 0.1–1.0)
Monocytes Relative: 5 %
Neutro Abs: 6.3 10*3/uL (ref 1.7–7.7)
Neutrophils Relative %: 74 %
Platelets: 347 10*3/uL (ref 150–400)
RBC: 4.92 MIL/uL (ref 4.22–5.81)
RDW: 12.8 % (ref 11.5–15.5)
WBC: 8.6 10*3/uL (ref 4.0–10.5)
nRBC: 0 % (ref 0.0–0.2)

## 2021-02-01 LAB — URINALYSIS, COMPLETE (UACMP) WITH MICROSCOPIC
Bacteria, UA: NONE SEEN
Bilirubin Urine: NEGATIVE
Glucose, UA: NEGATIVE mg/dL
Ketones, ur: NEGATIVE mg/dL
Leukocytes,Ua: NEGATIVE
Nitrite: NEGATIVE
Protein, ur: NEGATIVE mg/dL
Specific Gravity, Urine: 1.01 (ref 1.005–1.030)
Squamous Epithelial / HPF: NONE SEEN (ref 0–5)
WBC, UA: NONE SEEN WBC/hpf (ref 0–5)
pH: 6 (ref 5.0–8.0)

## 2021-02-01 LAB — LACTIC ACID, PLASMA: Lactic Acid, Venous: 0.8 mmol/L (ref 0.5–1.9)

## 2021-02-01 NOTE — ED Provider Notes (Signed)
Emergency Medicine Provider Triage Evaluation Note  Leslie Hobbs , a 40 y.o. male  was evaluated in triage.  Pt complains of persistent right thumb pain.  Patient was seen and evaluated on 12/4 and was started on Bactrim.  Patient reports that despite Bactrim use, he has had worsening right thumb pain, expression of purulence, fever and chills and has noticed streaking up his right arm.  Review of Systems  Positive: Patient has thumb pain.  Negative: No chest pain, chest tightness or SOB.   Physical Exam  Ht 6' (1.829 m)   Wt 81.6 kg   BMI 24.41 kg/m  Gen:   Awake, no distress   Resp:  Normal effort  MSK:   Unable to perform full range of motion of the right thumb.    Medical Decision Making  Medically screening exam initiated at 8:28 PM.  Appropriate orders placed.  Leslie Hobbs was informed that the remainder of the evaluation will be completed by another provider, this initial triage assessment does not replace that evaluation, and the importance of remaining in the ED until their evaluation is complete.     Leslie Hobbs, Leslie Hobbs 02/01/21 2029    Leslie Natal, MD 02/01/21 2040

## 2021-02-01 NOTE — ED Triage Notes (Signed)
Pt has pain in right thumb.  Pt slammed thumb in barn door 8 days ago.  Pt finished bactrim today.  Thumb bleeding and oozing.  Pt reports red streaks and pain in right arm.  Pt alert  speech clear.

## 2021-02-02 ENCOUNTER — Emergency Department
Admission: EM | Admit: 2021-02-02 | Discharge: 2021-02-02 | Disposition: A | Discharge status: Home / Self Care | Payer: Self-pay | Attending: Emergency Medicine | Admitting: Emergency Medicine

## 2023-07-03 IMAGING — CR DG HAND COMPLETE 3+V*R*
1 series · 3 of 3 positions shown · non-contrast
Comparison: 01/24/2021

CLINICAL DATA: Crush injury right thumb 8 days ago, erythema, pain

EXAM:
RIGHT HAND - COMPLETE 3+ VIEW

[Series 1: x hand pa right · 0.14mm/px · 3 of 3 slices shown]
[im 1/3]
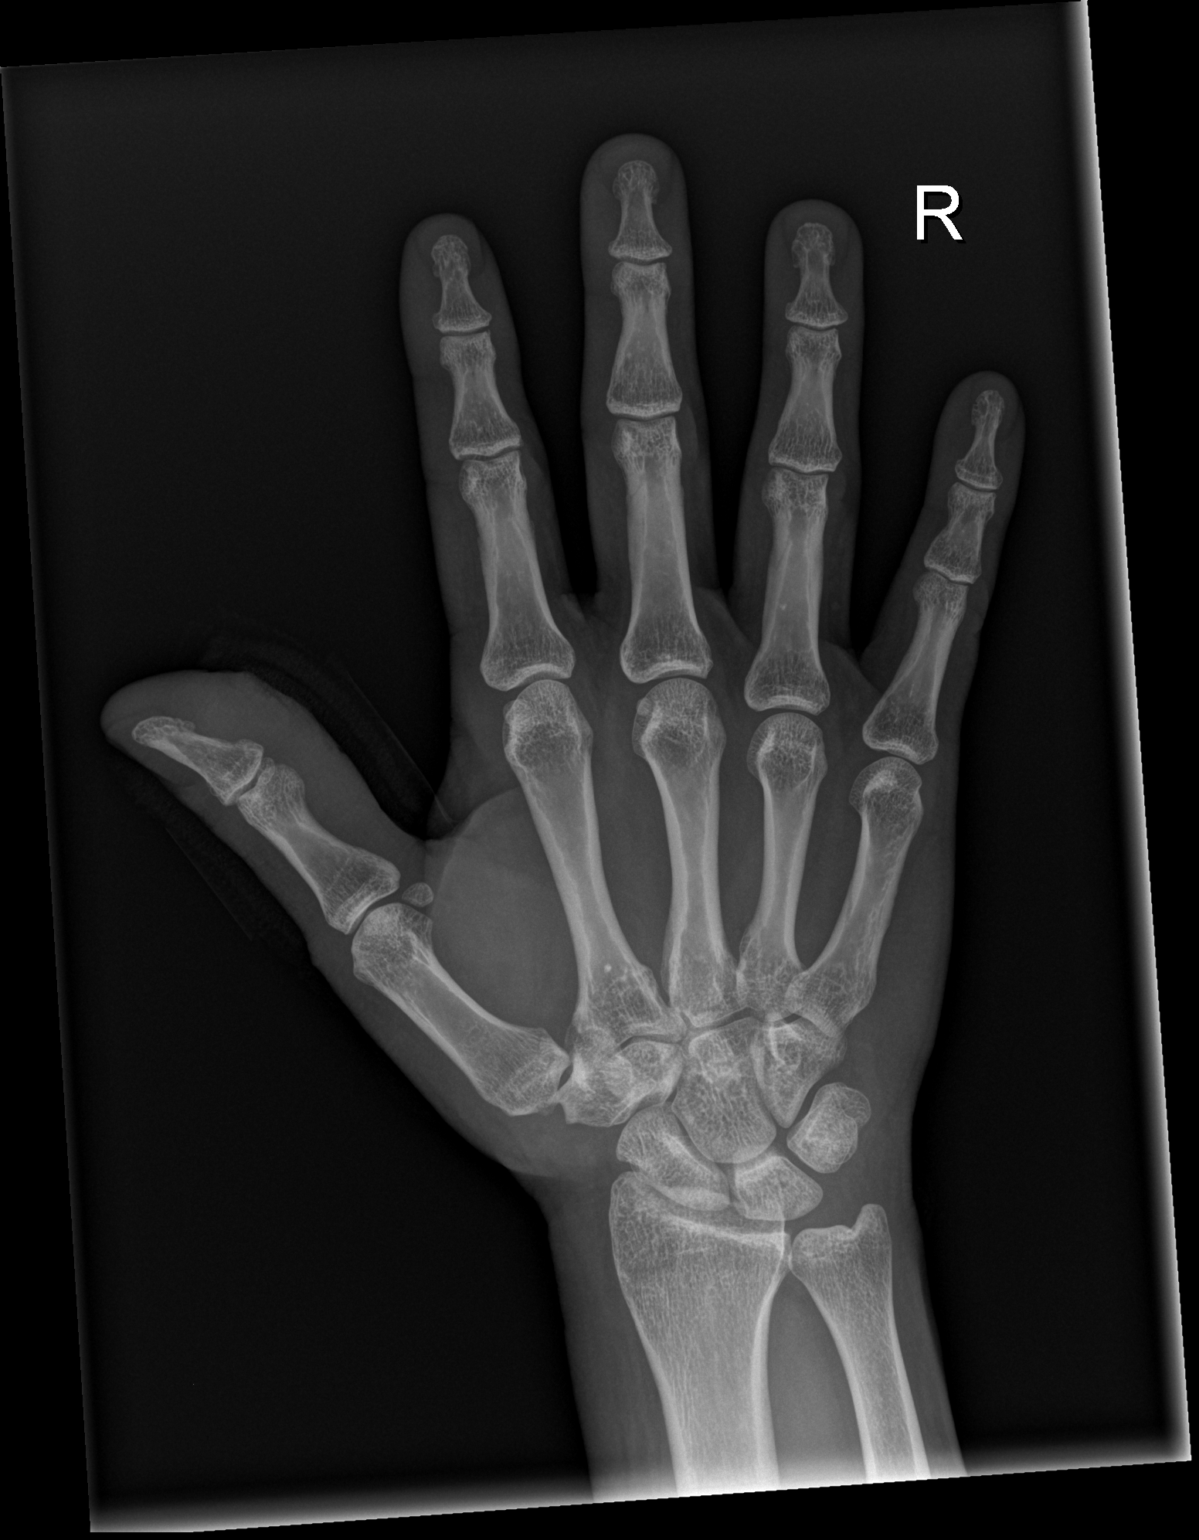
[im 2/3]
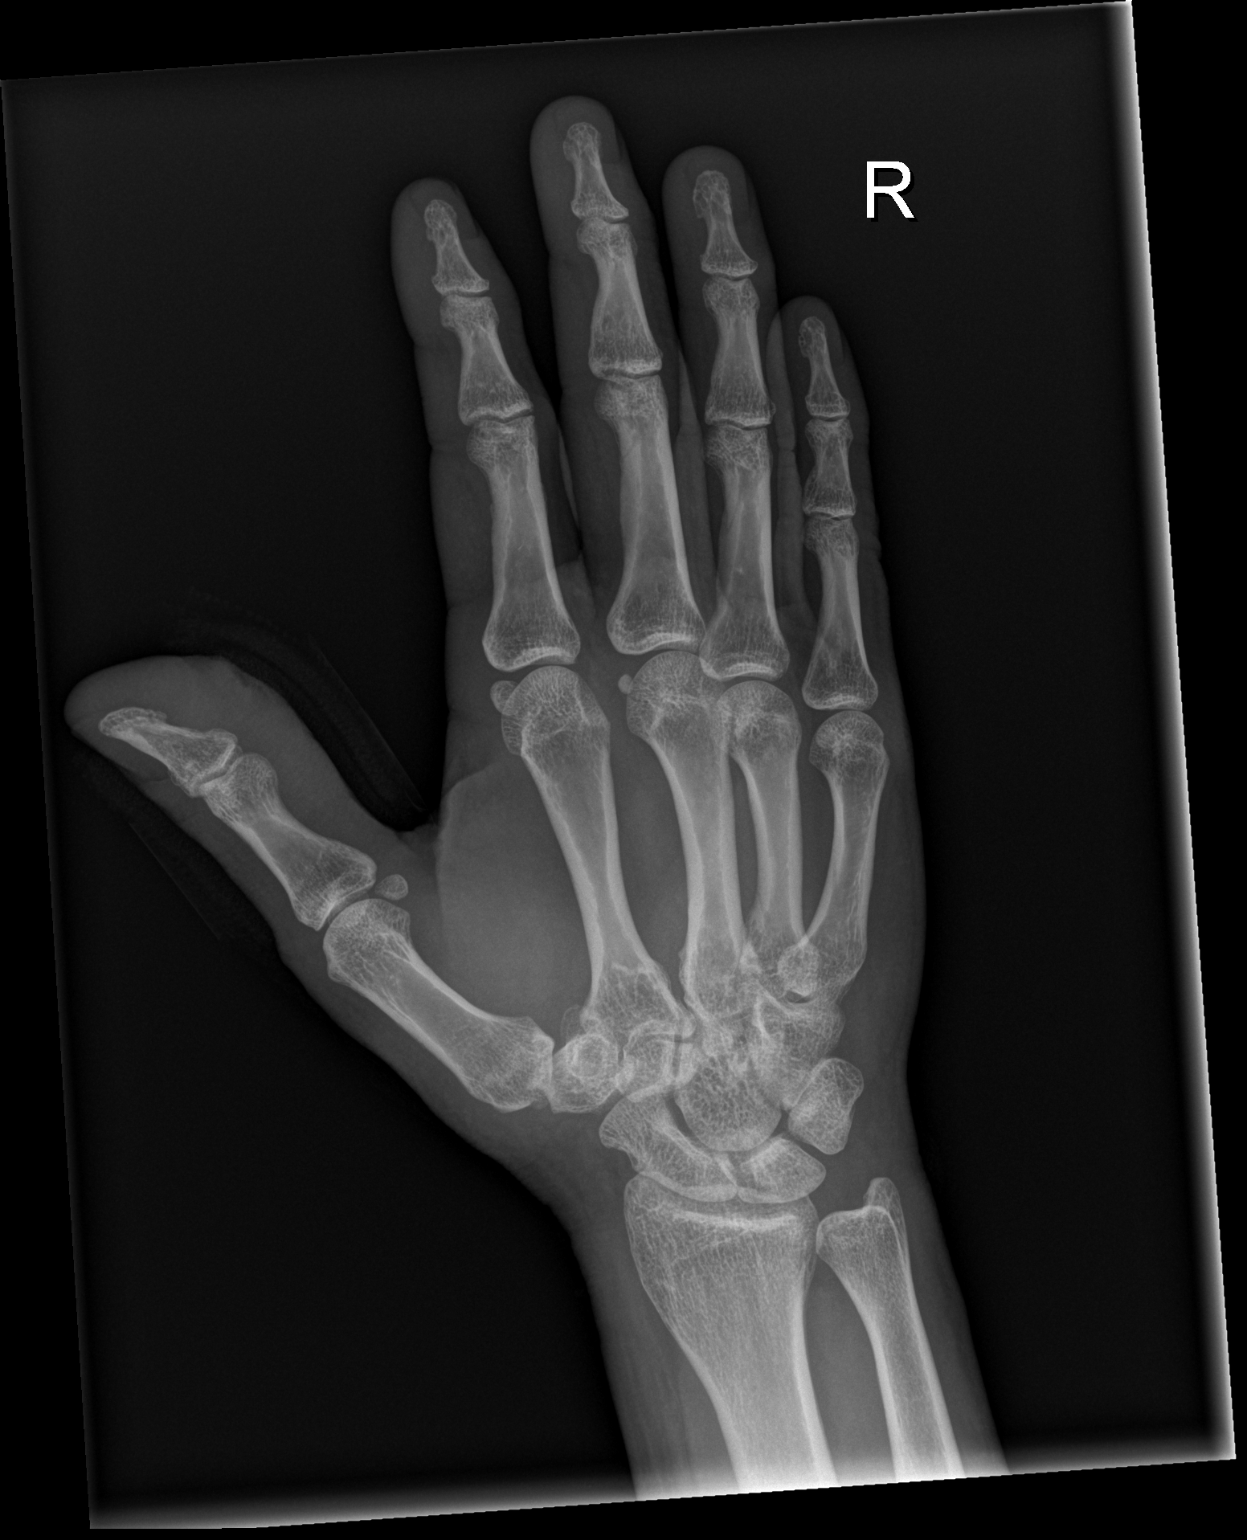
[im 3/3]
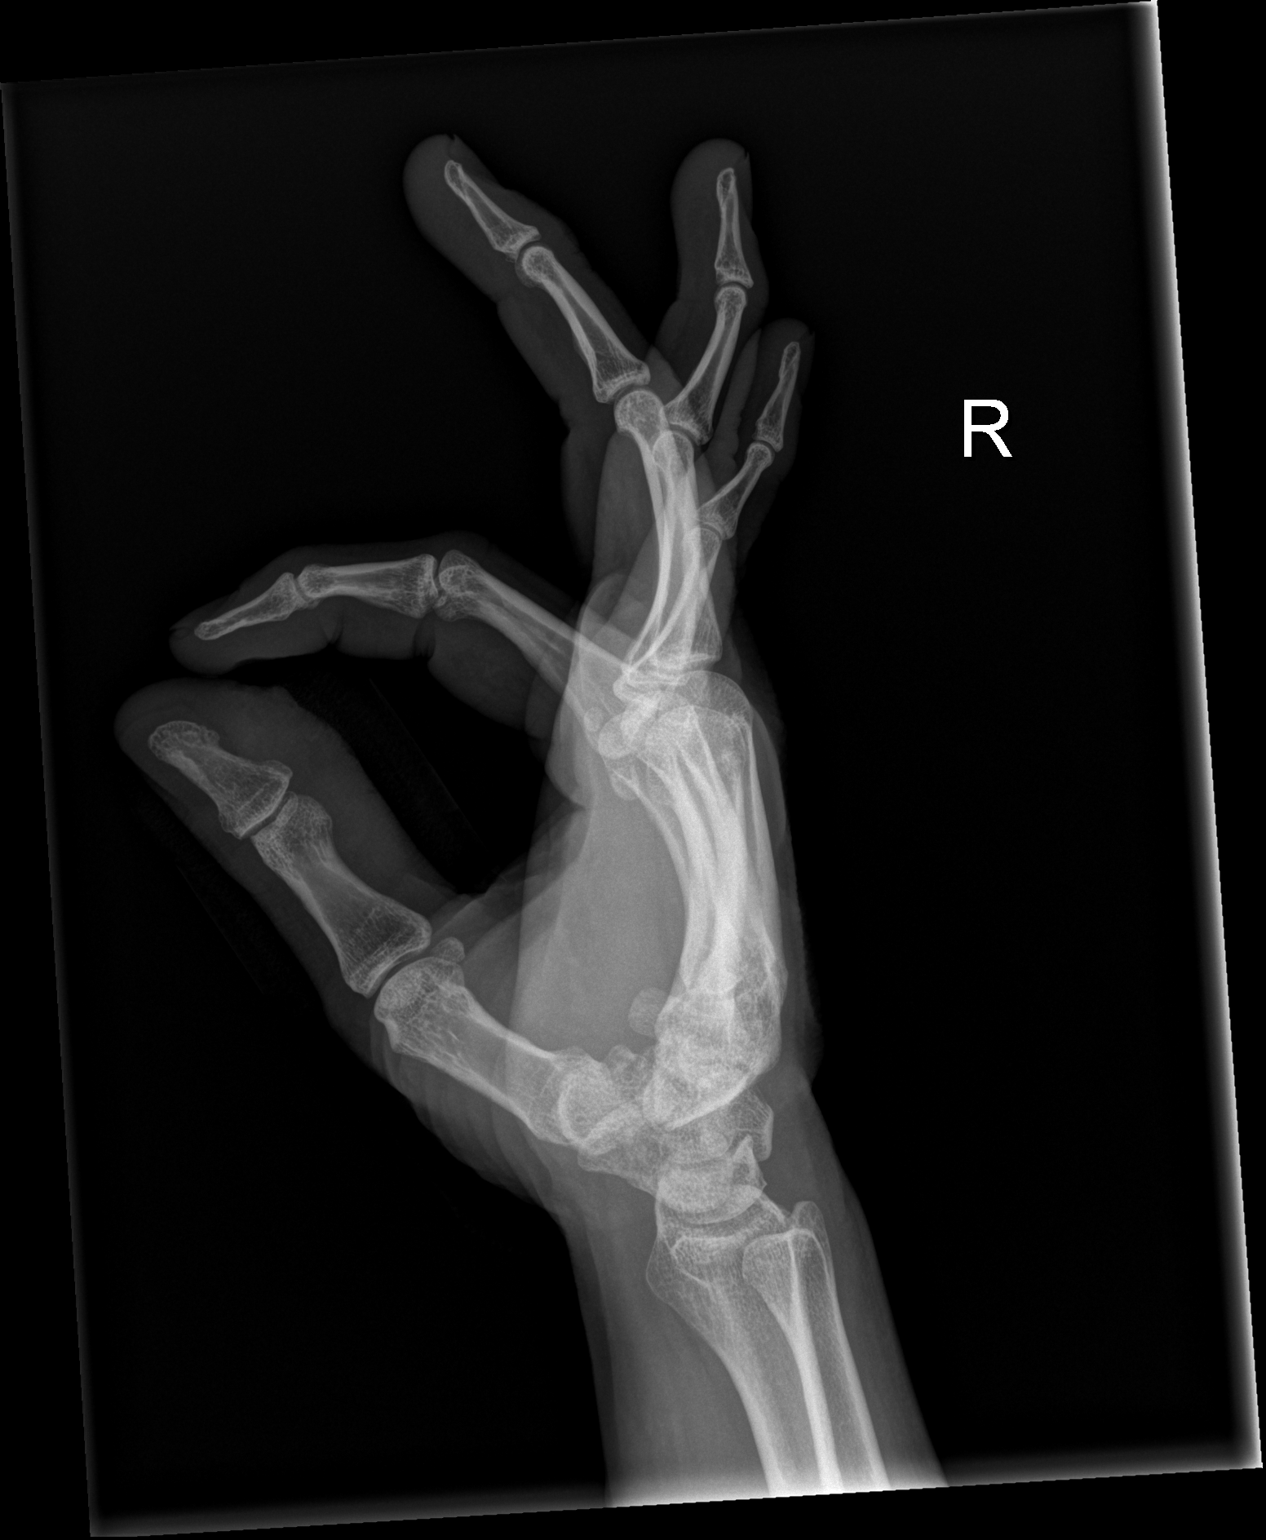

[3 of 3 positions shown; findings below may reference images not displayed]

FINDINGS: Frontal, oblique, and lateral views of the right hand are obtained.
There are no acute or destructive bony lesions. Soft tissue swelling
first digit consistent with history of trauma. No radiopaque foreign
body or evidence of subcutaneous gas. Underlying cellulitis cannot
be excluded. Joint spaces are well preserved.
IMPRESSION: 1. Soft tissue swelling first digit, with no evidence of radiopaque
foreign body, subcutaneous gas, or underlying osteomyelitis.
2. No acute fracture.
